# Patient Record
Sex: Female | Born: 2000 | Race: White | Hispanic: No | Marital: Married | Smoking: Never smoker
Health system: Southern US, Community
[De-identification: ages and names within clinical notes are randomized; demographics above are authoritative.]

## PROBLEM LIST (undated history)

## (undated) DIAGNOSIS — J45909 Unspecified asthma, uncomplicated: Secondary | ICD-10-CM

## (undated) DIAGNOSIS — L309 Dermatitis, unspecified: Secondary | ICD-10-CM

## (undated) HISTORY — DX: Unspecified asthma, uncomplicated: J45.909

## (undated) HISTORY — DX: Dermatitis, unspecified: L30.9

---
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
## 2023-07-03 ENCOUNTER — Ambulatory Visit (INDEPENDENT_AMBULATORY_CARE_PROVIDER_SITE_OTHER): Payer: Self-pay | Admitting: Allergy

## 2023-07-03 ENCOUNTER — Encounter: Payer: Self-pay | Admitting: Allergy

## 2023-07-03 ENCOUNTER — Other Ambulatory Visit: Payer: Self-pay

## 2023-07-03 VITALS — BP 110/52 | HR 59 | Temp 98.6°F | Resp 16 | Ht 66.0 in | Wt 160.4 lb

## 2023-07-03 DIAGNOSIS — L2089 Other atopic dermatitis: Secondary | ICD-10-CM | POA: Diagnosis not present

## 2023-07-03 DIAGNOSIS — L299 Pruritus, unspecified: Secondary | ICD-10-CM

## 2023-07-03 DIAGNOSIS — L509 Urticaria, unspecified: Secondary | ICD-10-CM | POA: Diagnosis not present

## 2023-07-03 MED ORDER — FEXOFENADINE HCL 180 MG PO TABS: 180.0000 mg | ORAL_TABLET | Freq: Every day | ORAL | 5 refills | Status: AC

## 2023-07-03 MED ORDER — FAMOTIDINE 20 MG PO TABS: 20.0000 mg | ORAL_TABLET | Freq: Two times a day (BID) | ORAL | 5 refills | Status: AC

## 2023-07-03 NOTE — Progress Notes (Signed)
 New Patient Note  RE: Joyce Wyatt MRN: [id number] DOB: [birth year] Date of Office Visit: 07/03/2023  Primary care provider: Patient, No Pcp Per  Chief Complaint: Itching and rash  History of present illness: Joyce Wyatt is a 23 y.o. female presenting today for evaluation of allergies. Discussed the use of AI scribe software for clinical note transcription with the patient, who gave verbal consent to proceed.  The patient has experienced chronic itching and rash since 2020, which began during her freshman year of college. The itching occurs almost daily and affects areas such as her calves, breasts, hips, hands, and occasionally around her eyes. The rash consists of raised, white skin-colored bumps that are not always visible but can be felt. Stress and environmental factors like sitting by a fire or taking a hot shower exacerbate the itching.  When she scratches the skin she states she can see red streaks where she scratched.  She has noted these symptom in different environments and does not have a seasonal predilection.  It is more pronounced at night and after eating. She also experiences itching after hot showers and applies lotion immediately afterward. No joint pain, fever, or significant swelling is associated with the rash, although she occasionally experiences a rash around her eyes that responds to eczema cream.  She states she sits cross-legged at work.  She has consulted a dermatologist three times and was prescribed steroid creams, which have not resolved the issue. She has switched to fragrance-free and sensitive skin products without improvement. She uses an over-the-counter eczema cream, Exederm, almost every other day for eczema control but has not helped much with the daily itching, and takes Benadryl orally and topically, which helps to calm the itching orally (topical benadryl has not helped). No biopsies have been performed by the dermatologist.  She has a family history of  thyroid issues, as her sister was recently diagnosed with thyroid problems. She underwent blood work last week due to irregular menstrual cycles  She occasionally experiences mild seasonal allergies and has had more ear problems in the past year, potentially related to her wisdom teeth growing in but she states she has enough room for them so she is not sure if she will get them pulled.     Review of systems: 10pt ROS negative unless noted in HPI   Past medical history: Past Medical History:  Diagnosis Date   Asthma    Eczema     Past surgical history: History reviewed. No pertinent surgical history.  Family history:  Family History  Problem Relation Age of Onset   Eczema Mother    Asthma Mother    Cancer Maternal Grandmother        stomach   Food Allergy Maternal Grandmother    Allergic rhinitis Neg Hx    Angioedema Neg Hx    Urticaria Neg Hx     Social history: Lives in a home without carpeting with gas heating and central cooling.  No pets in the home.  There is no concern for water damage, mildew or roaches in the home.  She is a Radiographer, therapeutic.  Denies a smoking history.   Medication List: No current outpatient medications on file.   No current facility-administered medications for this visit.    Known medication allergies: Allergies  Allergen Reactions   Azithromycin Nausea And Vomiting    Other Reaction(s): GI Intolerance   Hydromorphone Other (See Comments)    Pt reports hot flashes and muscle weakness  Morphine Other (See Comments)    Pt reports hot flashes and muscle weakness   Oseltamivir Phosphate Nausea And Vomiting     Physical examination: Blood pressure (!) 110/52, pulse (!) 59, temperature 98.6 F (37 C), temperature source Temporal, resp. rate 16, height 5\' 6"  (1.676 m), weight 160 lb 6.4 oz (72.8 kg), SpO2 100%.  General: Alert, interactive, in no acute distress. HEENT: PERRLA, TMs pearly gray, turbinates non-edematous  without discharge, post-pharynx non erythematous. Neck: Supple without lymphadenopathy. Lungs: Clear to auscultation without wheezing, rhonchi or rales. {no increased work of breathing. CV: Normal S1, S2 without murmurs. Abdomen: Nondistended, nontender. Skin: Warm and dry, without lesions or rashes. Extremities:  No clubbing, cyanosis or edema. Neuro:   Grossly intact.  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 Histamine response with benadryl use suggests possible allergic component.  Family history of thyroid issues noted.  - Itching and rash can be caused by a variety of different triggers including illness/infection, foods, medications, stings, exercise, pressure, vibrations, extremes of temperature to name a few however majority of the time there is no identifiable trigger.  Your symptoms have been ongoing for >6 weeks making this chronic thus will obtain labwork to evaluate: CBC w diff, CMP, tryptase, hive panel, environmental panel, alpha-gal panel - Recommend daily long-acting antihistamine (like Zyrtec, Allegra or Xyzal) and Pepcid taken both daily.   If needed for more control both can be taken twice a day.   - Advise moisturizing post-shower, avoid pressure and temperature changes. - Discussed histamine release by NSAIDs and opioids medications as side effects - these medications can cause itching and rash.  Eczema Eczema diagnosed two years ago, with stress-related flare-ups. Exoderm effective in managing symptoms. - Continue Exoderm for flare-ups as needed. - Consider stronger steroid cream if Exoderm ineffective.  Follow-up in 2-3 months or sooner if needed  I appreciate the opportunity to take part in Joyce Wyatt's care. Please do not hesitate to contact me with questions.  Sincerely,   Joyce Aye, MD Allergy/Immunology [facility]

## 2023-07-03 NOTE — Patient Instructions (Addendum)
 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 Histamine response with benadryl use suggests possible allergic component.  Family history of thyroid issues noted.  - Itching and rash can be caused by a variety of different triggers including illness/infection, foods, medications, stings, exercise, pressure, vibrations, extremes of temperature to name a few however majority of the time there is no identifiable trigger.  Your symptoms have been ongoing for >6 weeks making this chronic thus will obtain labwork to evaluate: CBC w diff, CMP, tryptase, hive panel, environmental panel, alpha-gal panel - Recommend daily long-acting antihistamine (like Zyrtec, Allegra or Xyzal) and Pepcid taken both daily.   If needed for more control both can be taken twice a day.   - Advise moisturizing post-shower, avoid pressure and temperature changes. - Discussed histamine release by NSAIDs and opioids medications as side effects - these medications can cause itching and rash.  Eczema Eczema diagnosed two years ago, with stress-related flare-ups. Exoderm effective in managing symptoms. - Continue Exoderm for flare-ups as needed. - Consider stronger steroid cream if Exoderm ineffective.  Follow-up in 2-3 months or sooner if needed

## 2023-07-12 ENCOUNTER — Encounter: Payer: Self-pay | Admitting: Allergy

## 2023-07-12 LAB — CBC WITH DIFFERENTIAL/PLATELET
Basophils Absolute: 0.1 10*3/uL (ref 0.0–0.2)
Basos: 1 %
EOS (ABSOLUTE): 0.1 10*3/uL (ref 0.0–0.4)
Eos: 2 %
Hematocrit: 40.6 % (ref 34.0–46.6)
Hemoglobin: 13.7 g/dL (ref 11.1–15.9)
Immature Grans (Abs): 0 10*3/uL (ref 0.0–0.1)
Immature Granulocytes: 0 %
Lymphocytes Absolute: 2.5 10*3/uL (ref 0.7–3.1)
Lymphs: 35 %
MCH: 30.5 pg (ref 26.6–33.0)
MCHC: 33.7 g/dL (ref 31.5–35.7)
MCV: 90 fL (ref 79–97)
Monocytes Absolute: 0.5 10*3/uL (ref 0.1–0.9)
Monocytes: 8 %
Neutrophils Absolute: 3.9 10*3/uL (ref 1.4–7.0)
Neutrophils: 54 %
Platelets: 322 10*3/uL (ref 150–450)
RBC: 4.49 x10E6/uL (ref 3.77–5.28)
RDW: 11.8 % (ref 11.7–15.4)
WBC: 7.1 10*3/uL (ref 3.4–10.8)

## 2023-07-12 LAB — TSH+FREE T4
Free T4: 1.4 ng/dL (ref 0.82–1.77)
TSH: 1.2 u[IU]/mL (ref 0.450–4.500)

## 2023-07-12 LAB — ALLERGENS W/TOTAL IGE AREA 2

## 2023-07-12 LAB — COMPREHENSIVE METABOLIC PANEL WITH GFR
ALT: 11 IU/L (ref 0–32)
AST: 14 IU/L (ref 0–40)
Albumin: 4.8 g/dL (ref 4.0–5.0)
Alkaline Phosphatase: 73 IU/L (ref 44–121)
BUN/Creatinine Ratio: 16 (ref 9–23)
BUN: 13 mg/dL (ref 6–20)
Bilirubin Total: 0.3 mg/dL (ref 0.0–1.2)
CO2: 24 mmol/L (ref 20–29)
Calcium: 9.5 mg/dL (ref 8.7–10.2)
Chloride: 103 mmol/L (ref 96–106)
Creatinine, Ser: 0.83 mg/dL (ref 0.57–1.00)
Globulin, Total: 2.5 g/dL (ref 1.5–4.5)
Glucose: 86 mg/dL (ref 70–99)
Potassium: 4.4 mmol/L (ref 3.5–5.2)
Sodium: 140 mmol/L (ref 134–144)
Total Protein: 7.3 g/dL (ref 6.0–8.5)
eGFR: 102 mL/min/{1.73_m2} (ref >59)

## 2023-07-12 LAB — CHRONIC URTICARIA: cu index: 7.3 (ref <10)

## 2023-07-12 LAB — ALPHA-GAL PANEL
Allergen Lamb IgE: <0.1 kU/L
Beef IgE: <0.1 kU/L
IgE (Immunoglobulin E), Serum: 4 [IU]/mL — ABNORMAL LOW (ref 6–495)
O215-IgE Alpha-Gal: <0.1 kU/L
Pork IgE: <0.1 kU/L

## 2023-07-12 LAB — THYROID ANTIBODIES (THYROPEROXIDASE & THYROGLOBULIN)
Thyroglobulin Antibody: <1 [IU]/mL (ref 0.0–0.9)
Thyroperoxidase Ab SerPl-aCnc: 13 [IU]/mL (ref 0–34)

## 2023-07-12 LAB — TRYPTASE: Tryptase: 3.9 ug/L (ref 2.2–13.2)
